# Patient Record
Sex: Female | Born: 1960 | Race: White | Hispanic: No | Marital: Married | Smoking: Current every day smoker
Health system: Southern US, Community
[De-identification: ages and names within clinical notes are randomized; demographics above are authoritative.]

---

## 2014-07-20 ENCOUNTER — Ambulatory Visit (INDEPENDENT_AMBULATORY_CARE_PROVIDER_SITE_OTHER): Payer: BLUE CROSS/BLUE SHIELD | Admitting: Podiatry

## 2014-07-20 VITALS — BP 136/78 | HR 74 | Ht 63.0 in | Wt 200.0 lb

## 2014-07-20 DIAGNOSIS — M2042 Other hammer toe(s) (acquired), left foot: Secondary | ICD-10-CM

## 2014-07-20 DIAGNOSIS — L97529 Non-pressure chronic ulcer of other part of left foot with unspecified severity: Secondary | ICD-10-CM

## 2014-07-20 DIAGNOSIS — L089 Local infection of the skin and subcutaneous tissue, unspecified: Secondary | ICD-10-CM

## 2014-07-20 MED ORDER — ERYTHROMYCIN BASE 250 MG PO TABS: 250.0000 mg | ORAL_TABLET | Freq: Four times a day (QID) | ORAL | Status: AC

## 2014-07-20 NOTE — Progress Notes (Signed)
Subjective: 54 year old female presents complaining of small toe on right hurts so bad and swollen. In between the last two toes feel like skin is breaking off. Hard to walk and hard to wear shoes. Duration of 2 years off and on. Last 6 months were bad and last couple of days were really bad.   ROS is normal other than last year heart issues and having high Cholesterol. Patient denies experiencing fever or chills.  Objective: Dermatologic: Painful lesion with white tissue build up 4th web space left foot. Upon removal of white necrotic tissue, noted of sinus tract deep to subdermal layer at 4th web space.  Minimum drainage and no mal odor noted.  Vascular: All pedal pulses are palpable. No edema or erythema noted.  Orthopedic: Enlarged phalanx 5th left.   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  Plan: Necrotic tissue debrided. Sinus tract opened to facilities drainage. Betadine soaked gauze packed with home care instruction to soak and dressing change daily. Return in one week. Take antibiotics as prescribed.

## 2014-07-20 NOTE — Patient Instructions (Signed)
Seen for pain in right 4th web space. Noted of infected corn with ulceration. Take antibiotics and return in one week.

## 2014-07-21 ENCOUNTER — Encounter: Payer: Self-pay | Admitting: Podiatry

## 2014-07-21 DIAGNOSIS — L089 Local infection of the skin and subcutaneous tissue, unspecified: Secondary | ICD-10-CM | POA: Insufficient documentation

## 2014-07-21 DIAGNOSIS — L97529 Non-pressure chronic ulcer of other part of left foot with unspecified severity: Secondary | ICD-10-CM | POA: Insufficient documentation

## 2014-07-21 DIAGNOSIS — M2040 Other hammer toe(s) (acquired), unspecified foot: Secondary | ICD-10-CM | POA: Insufficient documentation

## 2014-07-27 ENCOUNTER — Encounter: Payer: Self-pay | Admitting: Podiatry

## 2014-07-27 ENCOUNTER — Ambulatory Visit: Payer: BLUE CROSS/BLUE SHIELD | Admitting: Podiatry

## 2014-07-27 DIAGNOSIS — M2041 Other hammer toe(s) (acquired), right foot: Secondary | ICD-10-CM

## 2014-07-27 DIAGNOSIS — L97529 Non-pressure chronic ulcer of other part of left foot with unspecified severity: Secondary | ICD-10-CM

## 2014-07-27 NOTE — Patient Instructions (Signed)
Seen for right foot lesion. Reviewed surgical option, resection bone from 5th digit and partial syndactily 4th web space right foot.

## 2014-07-27 NOTE — Progress Notes (Unsigned)
Subjective: 54 year old female presents for one week follow up on right 4th web space lesion. Stated that she soaked and done daily wound care and taking antibiotics. Less painful and still sore.   Objective: Dermatologic: Painful lesion with open ulcer and surrounding white tissue build up 4th web space left foot. No drainage noted.  Vascular: All pedal pulses are palpable. No edema or erythema noted.  Orthopedic: Enlarged phalanx 5th left.   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  Plan: Ulcerated sinus tract 4th web space right foot.  Betadine soaked gauze packed with home care instruction to soak and dressing change daily. Return in one week. Take antibiotics as prescribed.

## 2014-08-06 ENCOUNTER — Telehealth: Payer: Self-pay | Admitting: *Deleted

## 2014-08-06 DIAGNOSIS — L97501 Non-pressure chronic ulcer of other part of unspecified foot limited to breakdown of skin: Secondary | ICD-10-CM

## 2014-08-06 DIAGNOSIS — M2040 Other hammer toe(s) (acquired), unspecified foot: Secondary | ICD-10-CM

## 2014-08-06 HISTORY — PX: SYNDACTYLIZATION TOES: SUR1320

## 2014-08-06 HISTORY — PX: OTHER SURGICAL HISTORY: SHX169

## 2014-08-06 NOTE — Telephone Encounter (Signed)
08/06/2014 Call today from [facility] @ [phone]. They needed to know quantity on Vicodin. I told them to put #30. Spoke with [employer] today. Surgery patient for today 08/06/2014.

## 2014-08-11 ENCOUNTER — Encounter: Payer: Self-pay | Admitting: Podiatry

## 2014-08-11 ENCOUNTER — Ambulatory Visit (INDEPENDENT_AMBULATORY_CARE_PROVIDER_SITE_OTHER): Payer: BLUE CROSS/BLUE SHIELD | Admitting: Podiatry

## 2014-08-11 DIAGNOSIS — M2041 Other hammer toe(s) (acquired), right foot: Secondary | ICD-10-CM

## 2014-08-11 NOTE — Progress Notes (Signed)
Doing ok other than the right foot got wet this morning. Was able to return to work today. Dressing is wet. Wound is dry with no sign of inflammation. Wound cleansed with Iodine and dry sterile dressing applied followed by dry Ace bandage. Continue to keep the area clean and dry. Return in one week.

## 2014-08-11 NOTE — Patient Instructions (Signed)
Post op one week doing well other than got it wet this morning. Dressing changed. Return in one week.

## 2014-08-18 ENCOUNTER — Ambulatory Visit (INDEPENDENT_AMBULATORY_CARE_PROVIDER_SITE_OTHER): Payer: BLUE CROSS/BLUE SHIELD | Admitting: Podiatry

## 2014-08-18 ENCOUNTER — Encounter: Payer: Self-pay | Admitting: Podiatry

## 2014-08-18 DIAGNOSIS — L97529 Non-pressure chronic ulcer of other part of left foot with unspecified severity: Secondary | ICD-10-CM

## 2014-08-18 NOTE — Patient Instructions (Signed)
Seen for post op wound. Suture removed. Need daily dressing. Return in one week.

## 2014-08-18 NOTE — Progress Notes (Signed)
12 days post op wound. Experiencing pain. Noted of drainage from wound. Suture removed. Betadine cleansing and Wet to dry dressing applied. Home care instruction given.

## 2014-08-25 ENCOUNTER — Ambulatory Visit (INDEPENDENT_AMBULATORY_CARE_PROVIDER_SITE_OTHER): Payer: BLUE CROSS/BLUE SHIELD | Admitting: Podiatry

## 2014-08-25 ENCOUNTER — Encounter: Payer: Self-pay | Admitting: Podiatry

## 2014-08-25 DIAGNOSIS — L97529 Non-pressure chronic ulcer of other part of left foot with unspecified severity: Secondary | ICD-10-CM

## 2014-08-25 NOTE — Progress Notes (Signed)
Surgical wound healing still in progress. Small spot of gap noted. Continue with dressing change x 2 weeks.

## 2014-08-25 NOTE — Patient Instructions (Signed)
Post op wound. Continue with daily dressing change. Return in 2 weeks.

## 2014-09-08 ENCOUNTER — Ambulatory Visit (INDEPENDENT_AMBULATORY_CARE_PROVIDER_SITE_OTHER): Payer: BLUE CROSS/BLUE SHIELD | Admitting: Podiatry

## 2014-09-08 ENCOUNTER — Encounter: Payer: Self-pay | Admitting: Podiatry

## 2014-09-08 DIAGNOSIS — L97529 Non-pressure chronic ulcer of other part of left foot with unspecified severity: Secondary | ICD-10-CM

## 2014-09-08 DIAGNOSIS — L570 Actinic keratosis: Secondary | ICD-10-CM | POA: Insufficient documentation

## 2014-09-08 DIAGNOSIS — R234 Changes in skin texture: Secondary | ICD-10-CM | POA: Insufficient documentation

## 2014-09-08 DIAGNOSIS — L989 Disorder of the skin and subcutaneous tissue, unspecified: Secondary | ICD-10-CM

## 2014-09-08 NOTE — Patient Instructions (Signed)
Seen for dry scaly calluses. Soaked and debrided. Return as needed.

## 2014-09-08 NOTE — Progress Notes (Signed)
4 weeks post op since partial syndactily 4th web with hammer toe repair 5th left. Wound healing has completed. Patient request treatment on fissuring calluses on both heels. Both feet soaked for 10 minutes. Fissured keratotic lesions on both heel debrided. Return as needed.

## 2021-06-06 ENCOUNTER — Other Ambulatory Visit: Payer: Self-pay

## 2021-06-06 ENCOUNTER — Emergency Department (HOSPITAL_BASED_OUTPATIENT_CLINIC_OR_DEPARTMENT_OTHER): Payer: No Typology Code available for payment source

## 2021-06-06 ENCOUNTER — Encounter (HOSPITAL_BASED_OUTPATIENT_CLINIC_OR_DEPARTMENT_OTHER): Payer: Self-pay

## 2021-06-06 ENCOUNTER — Emergency Department (HOSPITAL_BASED_OUTPATIENT_CLINIC_OR_DEPARTMENT_OTHER)
Admission: EM | Admit: 2021-06-06 | Discharge: 2021-06-06 | Disposition: A | Discharge status: Home / Self Care | Payer: No Typology Code available for payment source | Attending: Emergency Medicine | Admitting: Emergency Medicine

## 2021-06-06 DIAGNOSIS — S6391XA Sprain of unspecified part of right wrist and hand, initial encounter: Secondary | ICD-10-CM

## 2021-06-06 DIAGNOSIS — W1789XA Other fall from one level to another, initial encounter: Secondary | ICD-10-CM | POA: Diagnosis not present

## 2021-06-06 DIAGNOSIS — M25531 Pain in right wrist: Secondary | ICD-10-CM | POA: Insufficient documentation

## 2021-06-06 DIAGNOSIS — T148XXA Other injury of unspecified body region, initial encounter: Secondary | ICD-10-CM

## 2021-06-06 DIAGNOSIS — M79641 Pain in right hand: Secondary | ICD-10-CM | POA: Diagnosis present

## 2021-06-06 DIAGNOSIS — F17210 Nicotine dependence, cigarettes, uncomplicated: Secondary | ICD-10-CM | POA: Diagnosis not present

## 2021-06-06 DIAGNOSIS — Z23 Encounter for immunization: Secondary | ICD-10-CM | POA: Diagnosis not present

## 2021-06-06 DIAGNOSIS — S63501A Unspecified sprain of right wrist, initial encounter: Secondary | ICD-10-CM

## 2021-06-06 DIAGNOSIS — R030 Elevated blood-pressure reading, without diagnosis of hypertension: Secondary | ICD-10-CM

## 2021-06-06 DIAGNOSIS — Y93K1 Activity, walking an animal: Secondary | ICD-10-CM | POA: Insufficient documentation

## 2021-06-06 DIAGNOSIS — S8001XA Contusion of right knee, initial encounter: Secondary | ICD-10-CM

## 2021-06-06 DIAGNOSIS — S66911A Strain of unspecified muscle, fascia and tendon at wrist and hand level, right hand, initial encounter: Secondary | ICD-10-CM

## 2021-06-06 DIAGNOSIS — S80211A Abrasion, right knee, initial encounter: Secondary | ICD-10-CM | POA: Diagnosis not present

## 2021-06-06 DIAGNOSIS — W010XXA Fall on same level from slipping, tripping and stumbling without subsequent striking against object, initial encounter: Secondary | ICD-10-CM

## 2021-06-06 MED ORDER — TRAMADOL HCL 50 MG PO TABS
50.0000 mg | ORAL_TABLET | Freq: Once | ORAL | Status: AC
  Administered 2021-06-06: 50 mg via ORAL
  Filled 2021-06-06: qty 1

## 2021-06-06 MED ORDER — ACETAMINOPHEN 500 MG PO TABS
1000.0000 mg | ORAL_TABLET | Freq: Once | ORAL | Status: AC
  Administered 2021-06-06: 1000 mg via ORAL
  Filled 2021-06-06: qty 2

## 2021-06-06 MED ORDER — BACITRACIN ZINC 500 UNIT/GM EX OINT
TOPICAL_OINTMENT | Freq: Once | CUTANEOUS | Status: AC
  Administered 2021-06-06: 1 via TOPICAL
  Filled 2021-06-06: qty 28.35

## 2021-06-06 MED ORDER — TETANUS-DIPHTH-ACELL PERTUSSIS 5-2.5-18.5 LF-MCG/0.5 IM SUSY
0.5000 mL | PREFILLED_SYRINGE | Freq: Once | INTRAMUSCULAR | Status: AC
  Administered 2021-06-06: 0.5 mL via INTRAMUSCULAR
  Filled 2021-06-06: qty 0.5

## 2021-06-06 NOTE — ED Triage Notes (Addendum)
Pt states she fell while walking dog ~30 min PTA-pain to right hand/wrist/forearm and right knee-slow gait/crying-husband with pt

## 2021-06-06 NOTE — Discharge Instructions (Addendum)
It was our pleasure to provide your ER care today - we hope that you feel better.  Take acetaminophen or ibuprofen as need for pain.   Wear wrist brace for comfort/support. Keep abrasions very clean.   As we discussed, although tonight our radiologist did not see any acute fractures, there is the possibility of sprain, ligamentous injury, and/or occult bony fracture.  Follow up with orthopedist in 1-2 weeks if symptoms fail to resolve - call to arrange appointment.   Your blood pressure is high tonight - follow up with primary care doctor in the next 1-2 weeks for recheck.   Return to ER if worse, new symptoms, new/severe pain, severe headache, numbness/weakness, or other concern.   You were given pain meds in the ER - no driving for the next 4 hours.

## 2021-06-06 NOTE — ED Provider Notes (Signed)
[facility] EMERGENCY DEPARTMENT Provider Note   CSN: [id number] Arrival date & time: 06/06/21  2103     History Chief Complaint  Patient presents with   Fall    Chelsea Barry is a 60 y.o. female.  Patient c/o fall while walking dog just pta today. House/drive is downsloping incline, dog ran and pulled patient forward. Pt unsure of exact mechanism of injury ?onto outstretched right hand. Mainly c/o pain to right hand/wrist, acute onset, moderate-severe, dull, non radiating, worse w movement and palpation. Skin is intact to that area. Right hand dom. No numbness/weakness. No elbow or proximal arm pain. Also with scrapes/contusion to knees, right > left. Last tetanus unknown. Has been ambulatory since fall. Denies head contusion or injury. No loc. No headache. No neck or back pain. No radicular pain. No anticoag use. Felt fine, asymptomatic prior to fall.   The history is provided by the patient, medical records and the spouse.  Fall Pertinent negatives include no chest pain, no abdominal pain, no headaches and no shortness of breath.      History reviewed. No pertinent past medical history.  Patient Active Problem List   Diagnosis Date Noted   Fissure in skin of foot 09/08/2014   Keratosis 09/08/2014   Ulcer of left foot (HCC) 07/21/2014   Hammer toe 07/21/2014   Local infection of skin and subcutaneous tissue 07/21/2014    Past Surgical History:  Procedure Laterality Date   Hammer Toe Repair Right 08/06/2014   RT #5 @ [facility]   SYNDACTYLIZATION TOES Right 08/06/2014   RT #4 web @ [facility]     OB History   No obstetric history on file.     No family history on file.  Social History   Tobacco Use   Smoking status: Every Day    Types: Cigarettes  Vaping Use   Vaping Use: Never used  Substance Use Topics   Alcohol use: Yes    Comment: occ    Home Medications Prior to Admission medications   Medication Sig Start Date End Date Taking? Authorizing Provider   atorvastatin (LIPITOR) 40 MG tablet  07/12/14   [provider]  erythromycin (E-MYCIN) 250 MG tablet Take 1 tablet (250 mg total) by mouth 4 (four) times daily. 07/20/14   Sheard, Myeong O, DPM  HYDROcodone-acetaminophen (NORCO) 7.5-325 MG per tablet  08/06/14   [provider]  nabumetone (RELAFEN) 500 MG tablet  08/06/14   [provider]    Allergies    Penicillins  Review of Systems   Review of Systems  Constitutional:  Negative for fever.  HENT:  Negative for nosebleeds.   Eyes:  Negative for redness.  Respiratory:  Negative for shortness of breath.   Cardiovascular:  Negative for chest pain.  Gastrointestinal:  Negative for abdominal pain and vomiting.  Genitourinary:  Negative for flank pain.  Musculoskeletal:  Negative for back pain and neck pain.  Skin:        Abrasion to right knee  Neurological:  Negative for weakness, numbness and headaches.  Hematological:  Does not bruise/bleed easily.  Psychiatric/Behavioral:  Negative for confusion.    Physical Exam Updated Vital Signs BP (!) 173/104 (BP Location: Left Arm)   Pulse 78   Temp 98.5 F (36.9 C) (Oral)   Resp (!) 24   Ht 1.626 m (5\' 4" )   Wt 95.3 kg   SpO2 99%   BMI 36.05 kg/m   Physical Exam Vitals and nursing note reviewed.  Constitutional:  Appearance: Normal appearance. She is well-developed.  HENT:     Head: Atraumatic.     Comments: No facial or scalp swelling or tenderness noted.     Nose: Nose normal.     Mouth/Throat:     Mouth: Mucous membranes are moist.  Eyes:     General: No scleral icterus.    Conjunctiva/sclera: Conjunctivae normal.     Pupils: Pupils are equal, round, and reactive to light.  Neck:     Trachea: No tracheal deviation.  Cardiovascular:     Rate and Rhythm: Normal rate.     Pulses: Normal pulses.  Pulmonary:     Effort: Pulmonary effort is normal. No respiratory distress.  Abdominal:     General: There is no distension.     Tenderness:  There is no abdominal tenderness.  Genitourinary:    Comments: No cva tenderness.  Musculoskeletal:     Cervical back: Normal range of motion and neck supple. No rigidity or tenderness. No muscular tenderness.     Comments: Mild swelling and tenderness right wrist and hand, diffusely. No focal scaphoid pain/tenderness. No deformity noted. Skin is intact. Intact movement of digits. Normal cap refill distally. Radial pulse 2+. Mild tenderness bilateral knees anteriorly, right knee with superficial abrasions. No fb seen/felt. Knees grossly stable. No effusion. No other focal pain or bony tenderness noted. CTLS spine, non tender, aligned, no step off.   Skin:    General: Skin is warm and dry.     Findings: No rash.  Neurological:     Mental Status: She is alert.     Comments: Alert, speech normal. GCS 15. Motor/sens grossly intact bil. Steady gait.  Psychiatric:        Mood and Affect: Mood normal.    ED Results / Procedures / Treatments   Labs (all labs ordered are listed, but only abnormal results are displayed) Labs Reviewed - No data to display  EKG None  Radiology DG Forearm Right  Result Date: 06/06/2021 CLINICAL DATA:  Fall, right forearm injury EXAM: RIGHT FOREARM - 2 VIEW COMPARISON:  None. FINDINGS: There is no evidence of fracture or other focal bone lesions. Ulnar negative variance noted. Soft tissues are unremarkable. IMPRESSION: No acute abnormality. Electronically Signed   By: Helyn Numbers M.D.   On: 06/06/2021 22:04   DG Wrist Complete Right  Result Date: 06/06/2021 CLINICAL DATA:  Fall, right wrist injury EXAM: RIGHT WRIST - COMPLETE 3+ VIEW COMPARISON:  None. FINDINGS: There is no evidence of fracture or dislocation. There is no evidence of arthropathy or other focal bone abnormality. Soft tissues are unremarkable. IMPRESSION: Negative. Electronically Signed   By: Helyn Numbers M.D.   On: 06/06/2021 22:03   DG Knee Complete 4 Views Right  Result Date:  06/06/2021 CLINICAL DATA:  Fall, right knee injury EXAM: RIGHT KNEE - COMPLETE 4+ VIEW COMPARISON:  None. FINDINGS: Normal alignment. No acute fracture or dislocation. 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 No effusion. Soft tissues are unremarkable. IMPRESSION: No acute abnormality. Electronically Signed   By: Helyn Numbers M.D.   On: 06/06/2021 22:04   DG Hand Complete Right  Result Date: 06/06/2021 CLINICAL DATA:  Fall EXAM: RIGHT HAND - COMPLETE 3+ VIEW COMPARISON:  None. FINDINGS: There is no evidence of fracture or dislocation. There is no evidence of arthropathy or other focal bone abnormality. Soft tissues are unremarkable. IMPRESSION: Negative. Electronically Signed   By: Helyn Numbers M.D.   On: 06/06/2021 22:02  Procedures Procedures   Medications Ordered in ED Medications  Tdap (BOOSTRIX) injection 0.5 mL (has no administration in time range)  acetaminophen (TYLENOL) tablet 1,000 mg (has no administration in time range)  traMADol (ULTRAM) tablet 50 mg (has no administration in time range)    ED Course  I have reviewed the triage vital signs and the nursing notes.  Pertinent labs & imaging results that were available during my care of the patient were reviewed by me and considered in my medical decision making (see chart for details).    MDM Rules/Calculators/A&P                           Xrays.   Reviewed nursing notes and prior charts for additional history.   No meds pta. Acetaminophen po. Ultram po. Icepack. Wrist brace applied.  Wound care, bacitracin to abrasions.   Xrays reviewed/interpreted by me - no definite fx. Discussed possibility occult fx and/or sprain or ligamentous injury.   Pt appears stable for d/c.   Rec ortho f/u.  Return precautions provided.    Final Clinical Impression(s) / ED Diagnoses Final diagnoses:  None    Rx / DC Orders ED Discharge Orders     None         Cathren Laine, MD 06/06/21 2249

## 2022-11-08 IMAGING — DX DG KNEE COMPLETE 4+V*R*
4 series · 4 of 4 positions shown · non-contrast
Comparison: None.

CLINICAL DATA: Fall, right knee injury

EXAM:
RIGHT KNEE - COMPLETE 4+ VIEW

[knee ap]
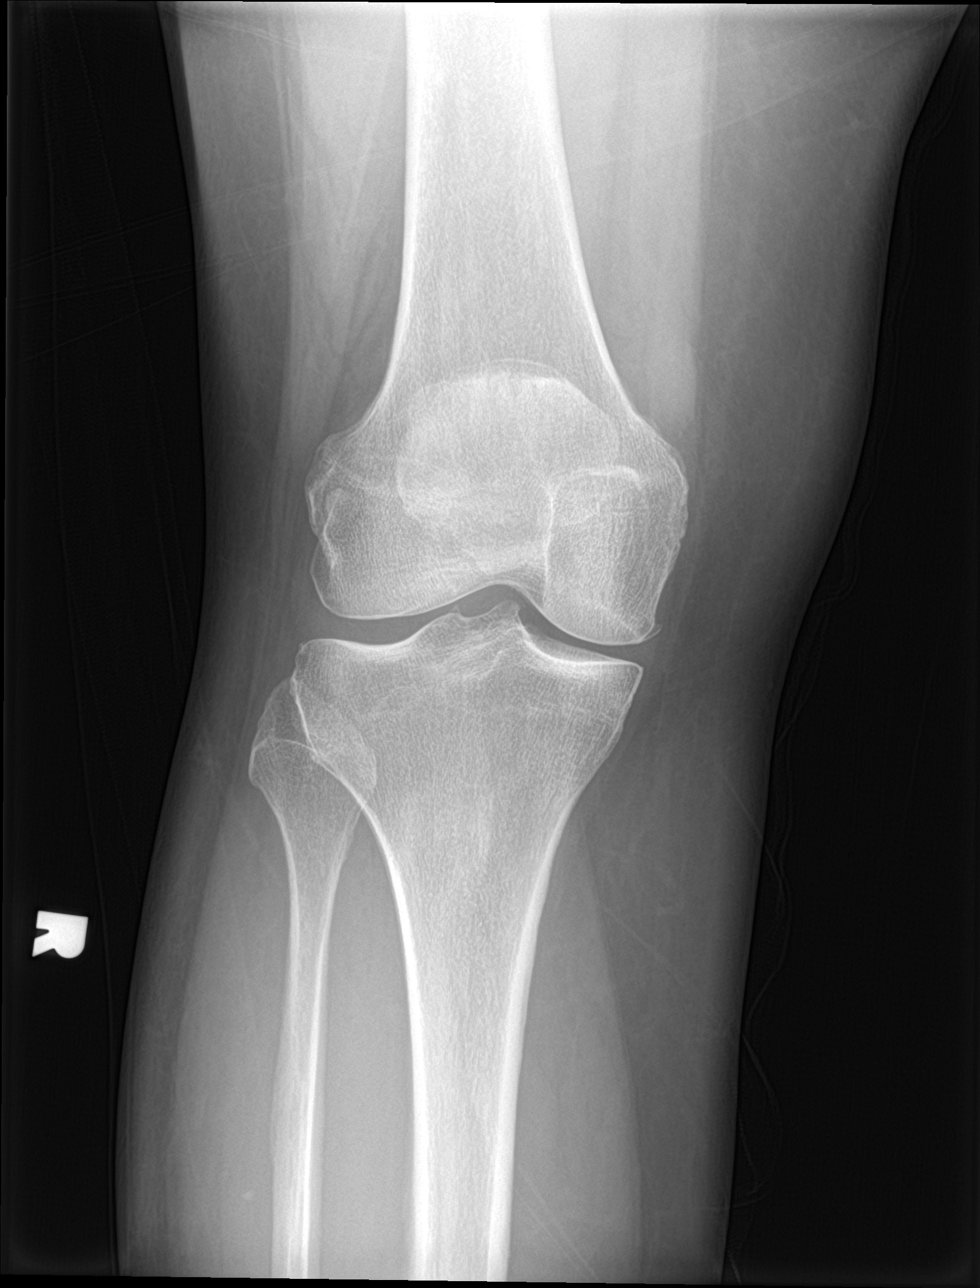

[knee lat]
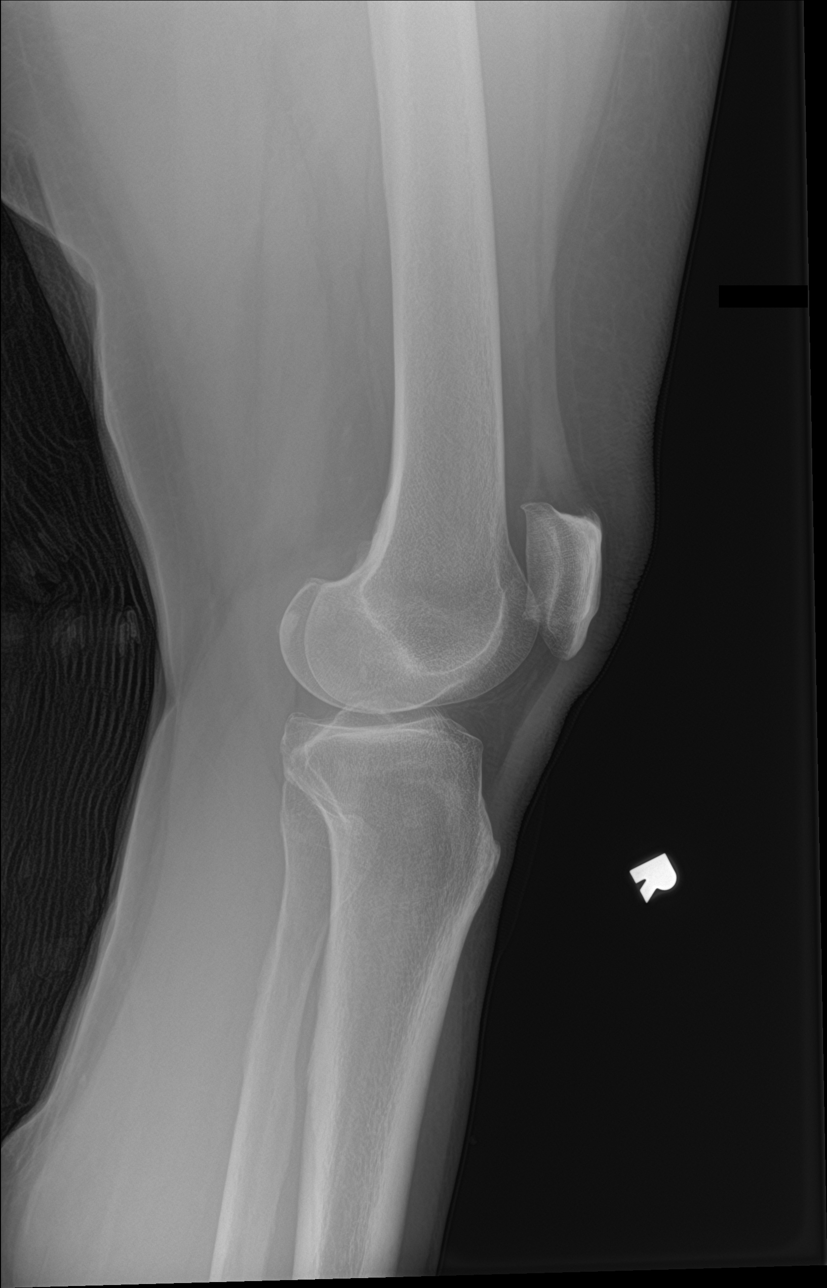

[knee obl (1 of 2)]
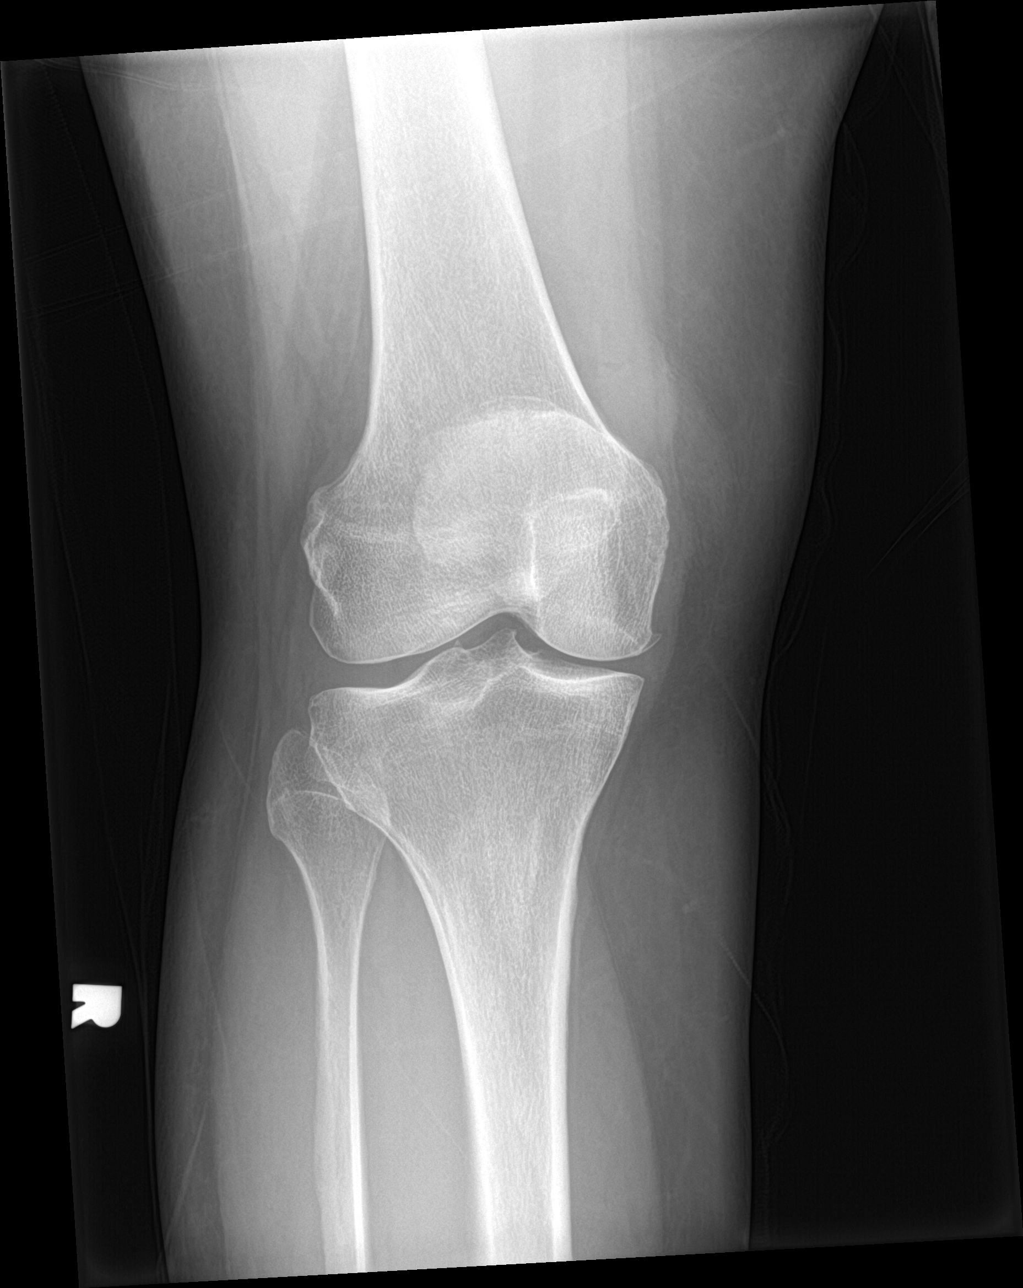

[knee obl (2 of 2)]
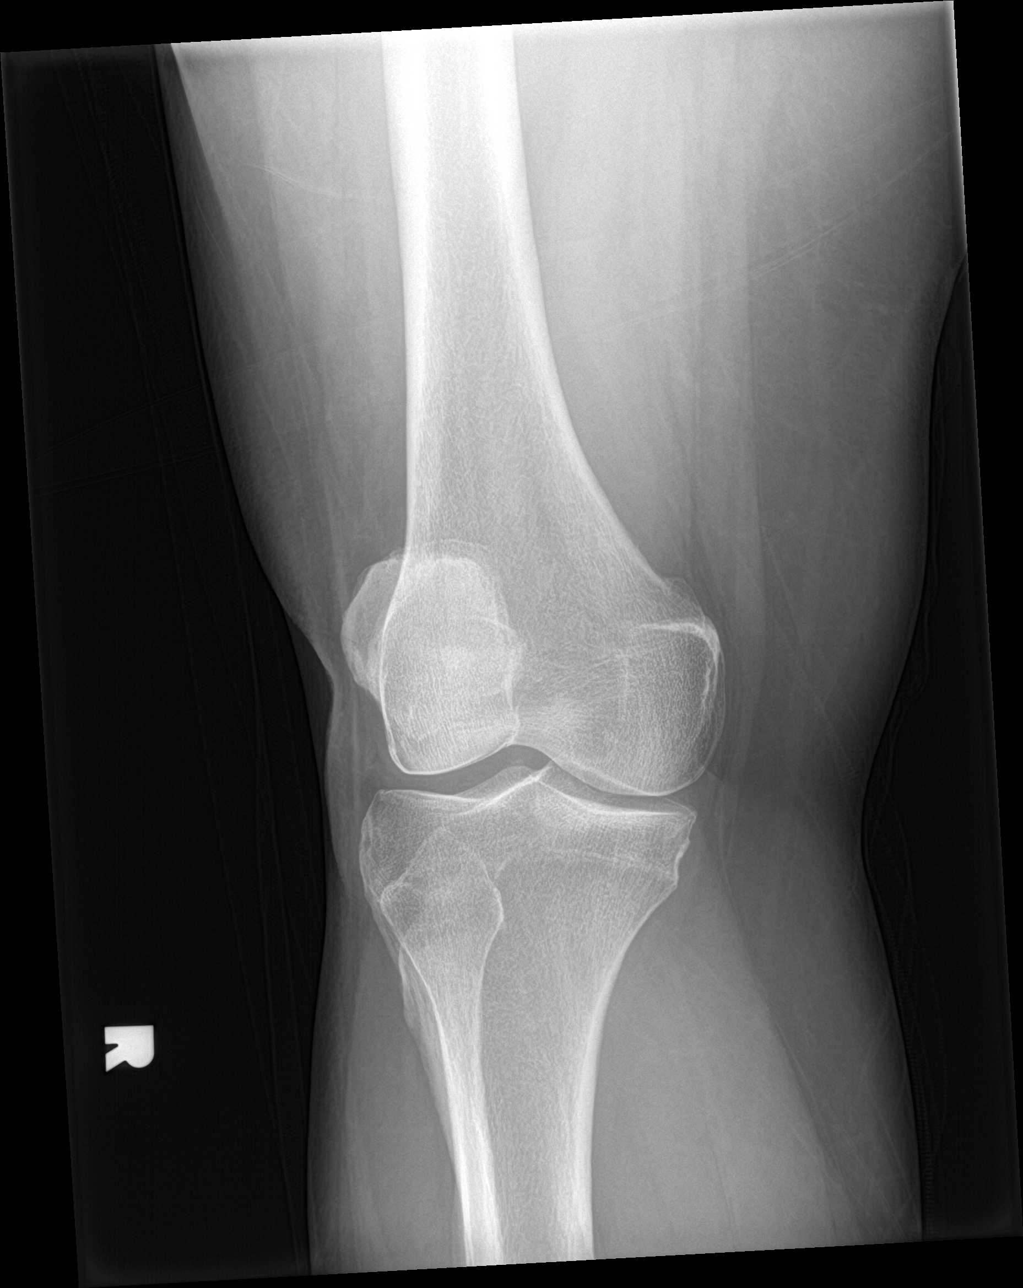

[4 of 4 positions shown; findings below may reference images not displayed]

FINDINGS: Normal alignment. No acute fracture or dislocation. Mild
bicompartmental degenerative arthritis involving the patellofemoral
and medial compartments. No effusion. Soft tissues are unremarkable.
IMPRESSION: No acute abnormality.
